# Patient Record
Sex: Male | Born: 1990 | Race: White | Hispanic: No | Marital: Single | Smoking: Current some day smoker
Health system: Southern US, Community
[De-identification: ages and names within clinical notes are randomized; demographics above are authoritative.]

## PROBLEM LIST (undated history)

## (undated) DIAGNOSIS — F32A Depression, unspecified: Secondary | ICD-10-CM

## (undated) DIAGNOSIS — F1990 Other psychoactive substance use, unspecified, uncomplicated: Secondary | ICD-10-CM

## (undated) DIAGNOSIS — F329 Major depressive disorder, single episode, unspecified: Secondary | ICD-10-CM

## (undated) HISTORY — DX: Major depressive disorder, single episode, unspecified: F32.9

## (undated) HISTORY — DX: Other psychoactive substance use, unspecified, uncomplicated: F19.90

## (undated) HISTORY — DX: Depression, unspecified: F32.A

---

## 2017-05-19 ENCOUNTER — Other Ambulatory Visit: Payer: Self-pay | Admitting: *Deleted

## 2017-05-19 ENCOUNTER — Ambulatory Visit
Admission: RE | Admit: 2017-05-19 | Discharge: 2017-05-19 | Disposition: A | Discharge status: Home / Self Care | Payer: No Typology Code available for payment source | Source: Ambulatory Visit | Attending: *Deleted | Admitting: *Deleted

## 2017-05-19 DIAGNOSIS — R222 Localized swelling, mass and lump, trunk: Secondary | ICD-10-CM

## 2017-05-24 ENCOUNTER — Other Ambulatory Visit: Payer: Self-pay | Admitting: *Deleted

## 2017-05-24 DIAGNOSIS — R222 Localized swelling, mass and lump, trunk: Secondary | ICD-10-CM

## 2017-05-28 ENCOUNTER — Ambulatory Visit
Admission: RE | Admit: 2017-05-28 | Discharge: 2017-05-28 | Disposition: A | Discharge status: Home / Self Care | Payer: 59 | Source: Ambulatory Visit | Attending: *Deleted | Admitting: *Deleted

## 2017-05-28 DIAGNOSIS — R222 Localized swelling, mass and lump, trunk: Secondary | ICD-10-CM

## 2017-05-28 MED ORDER — IOPAMIDOL (ISOVUE-300) INJECTION 61%
75.0000 mL | Freq: Once | INTRAVENOUS | Status: AC | PRN
  Administered 2017-05-28: 75 mL via INTRAVENOUS

## 2017-06-01 ENCOUNTER — Other Ambulatory Visit: Payer: Self-pay | Admitting: Family Medicine

## 2017-06-01 DIAGNOSIS — R222 Localized swelling, mass and lump, trunk: Secondary | ICD-10-CM

## 2017-06-01 DIAGNOSIS — N2889 Other specified disorders of kidney and ureter: Secondary | ICD-10-CM

## 2017-06-08 ENCOUNTER — Ambulatory Visit
Admission: RE | Admit: 2017-06-08 | Discharge: 2017-06-08 | Disposition: A | Discharge status: Home / Self Care | Payer: 59 | Source: Ambulatory Visit | Attending: Family Medicine | Admitting: Family Medicine

## 2017-06-08 DIAGNOSIS — N2889 Other specified disorders of kidney and ureter: Secondary | ICD-10-CM

## 2017-06-08 DIAGNOSIS — R222 Localized swelling, mass and lump, trunk: Secondary | ICD-10-CM

## 2017-06-08 MED ORDER — GADOBENATE DIMEGLUMINE 529 MG/ML IV SOLN
12.0000 mL | Freq: Once | INTRAVENOUS | Status: AC | PRN
  Administered 2017-06-08: 12 mL via INTRAVENOUS

## 2018-08-29 IMAGING — MR MR CHEST MEDIASTINUM WO/W CM
13 series · 16 of 16 positions shown · IV contrast (multihance)
Comparison: PA and lateral chest 05/19/2017.  CT chest 05/28/2017.

CLINICAL DATA: Painful mass lesion on the left side of the chest
which the patient first noticed approximately 6 weeks ago.

EXAM:
MRI CHEST WITHOUT AND WITH CONTRAST
TECHNIQUE: Multiplanar multisequence MR imaging of the chest centered at the
site of the patient's mass was performed before and after contrast
administration.
CONTRAST:  12 cc MultiHance IV.

[Series 4: t1_fl2d_tra_p2_mbh_320 · axial · 5.0mm · 0.69mm/px · 1 of 26 slices shown (1 of 2)]
[im 1/26]
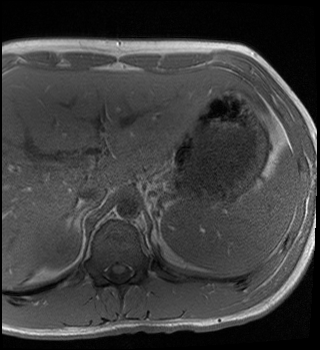

[Series 5: t2_haste_fs_tra_p2_mbh_320 · axial · 5.0mm · 0.69mm/px · 1 of 1 slices shown (1 of 2)]
[im 1/1]
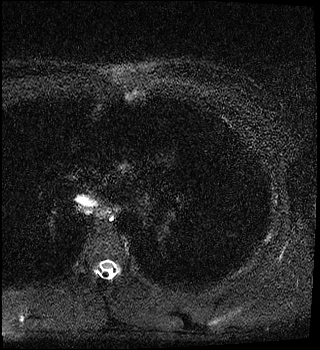

[Series 8: t1_fl2d_tra_p2_mbh_320 · axial · 5.0mm · 0.94mm/px · 1 of 26 slices shown (2 of 2)]
[im 1/26]
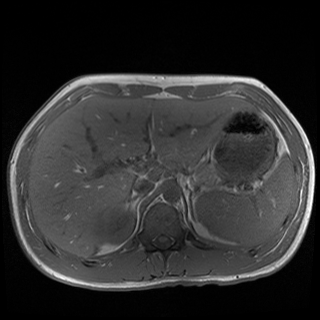

[Series 9: t1_fl2d_fs_tra_p2_mbh_320 · axial · 5.0mm · 0.94mm/px · 1 of 26 slices shown]
[im 1/26]
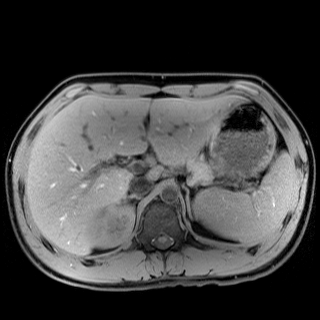

[Series 10: t1_fl2d_fs_sag_p2_mbh_384 · sagittal · 5.0mm · 0.78mm/px · 1 of 24 slices shown (1 of 2)]
[im 1/24]
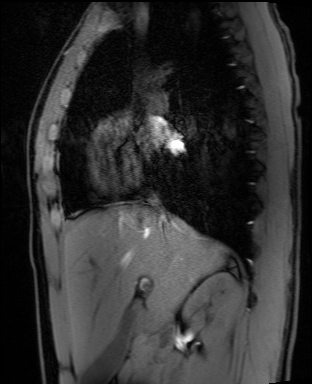

[Series 11: t2_haste_fs_sag_p3_mbh · sagittal · 5.0mm · 0.78mm/px · 1 of 24 slices shown]
[im 1/24]
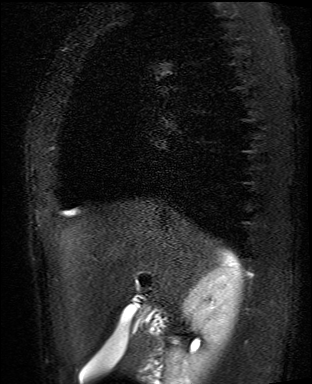

[Series 12: t2_haste_fs_cor_p3_mbh · coronal · 5.0mm · 0.78mm/px · 1 of 20 slices shown]
[im 1/20]
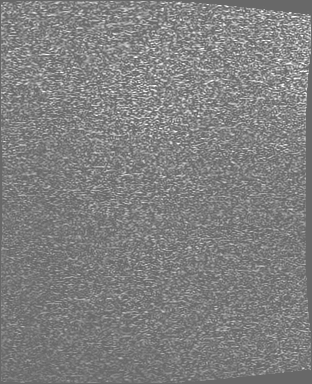

[Series 13: t2_haste_fs_tra_p2_mbh_320 · axial · 5.0mm · 0.94mm/px · 1 of 26 slices shown (2 of 2)]
[im 1/26]
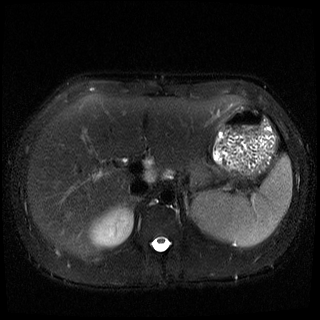

[Series 14: t1_fl2d_sag_p2_mbh_384 · sagittal · 5.0mm · 0.78mm/px · 1 of 24 slices shown]
[im 1/24]
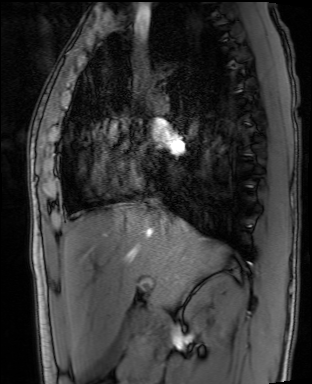

[Series 15: T1 dynamic · axial · non-contrast · 3.0mm · 1.25mm/px · z∈[-37,+104]mm · 2 of 48 slices shown (1 of 2)]
[im 1/48]
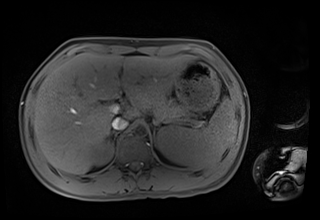
[im 48/48]
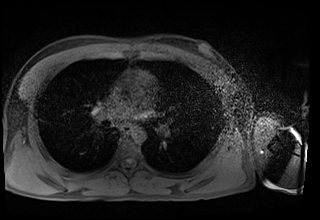

[Series 16: T1 dynamic post-contrast · axial · 3.0mm · 1.25mm/px · z∈[-37,+104]mm · 2 of 48 slices shown]
[im 1/48]
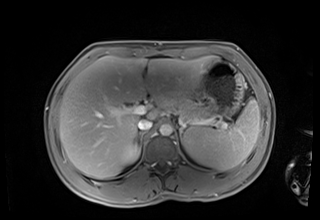
[im 48/48]
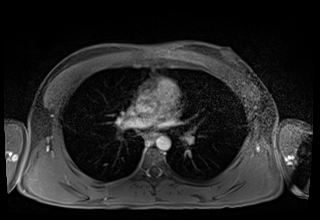

[Series 17: t1_fl2d_fs_sag_p2_mbh_384 · sagittal · 5.0mm · 0.78mm/px · 1 of 24 slices shown (2 of 2)]
[im 1/24]
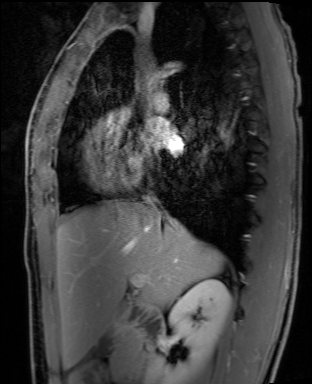

[Series 18: T1 dynamic · sagittal · 3.0mm · 1.25mm/px · 2 of 48 slices shown (2 of 2)]
[im 1/48]
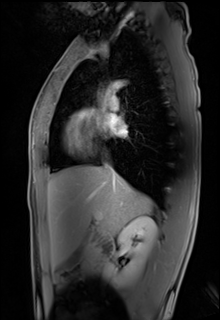
[im 48/48]
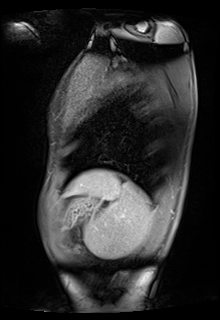

[16 of 16 positions shown; findings below may reference images not displayed]

FINDINGS: As seen on the patient's CT scan, there is a mass lesion in the
anterior chest wall centered between the anterior margins of the
third and fourth ribs. The lesion is hypointense to skeletal muscle
on T1 weighted imaging and T2 hyperintense with areas of more
intermediate and very high signal intensity on T2 weighted imaging.
The mass demonstrates contrast enhancement. It measures
approximately 2.2 cm AP x 8.5 cm transverse x 6.4 cm craniocaudal.
There is abnormal hole edema and enhancement in the the far left
periphery of the sternum at the site of the lesion. The abnormal
edema and enhancement are also seen in the lateral 2.2 cm of the
left fourth rib. Abnormal enhancement extends into the pectoralis
major which is superficial to the lesion. The lesion does not appear
to extend into the thoracic cavity.
IMPRESSION: Enhancing mass lesion in the anterior left chest wall as described
above cannot be definitively characterized but is worrisome for
malignant tumor such as Ewing sarcoma or dedifferentiated
chondrosarcoma. Fibromatosis is also within the differential.
Recommend biopsy and consultation recommend consultation with
Orthopedic Oncology and biopsy.

## 2020-04-01 ENCOUNTER — Ambulatory Visit (INDEPENDENT_AMBULATORY_CARE_PROVIDER_SITE_OTHER): Payer: Managed Care, Other (non HMO) | Admitting: Internal Medicine

## 2020-04-01 ENCOUNTER — Encounter: Payer: Self-pay | Admitting: Internal Medicine

## 2020-04-01 ENCOUNTER — Other Ambulatory Visit: Payer: Self-pay

## 2020-04-01 ENCOUNTER — Telehealth: Payer: Self-pay | Admitting: Pharmacy Technician

## 2020-04-01 VITALS — BP 119/81 | HR 86 | Temp 97.9°F | Wt 137.0 lb

## 2020-04-01 DIAGNOSIS — Z113 Encounter for screening for infections with a predominantly sexual mode of transmission: Secondary | ICD-10-CM | POA: Insufficient documentation

## 2020-04-01 DIAGNOSIS — B182 Chronic viral hepatitis C: Secondary | ICD-10-CM | POA: Diagnosis not present

## 2020-04-01 NOTE — Telephone Encounter (Signed)
[facility] Patient Product/process development scientist completed.    The patient is uninsured (his plan will not cover specialty medications) and will need patient assistance for medication.  We can complete the application and will need to meet with the patient for signatures and income documentation.  Netty Starring. Dimas Aguas CPhT Specialty Pharmacy Patient [facility] for Infectious Disease Phone: [phone] Fax:  [phone]

## 2020-04-01 NOTE — Assessment & Plan Note (Signed)
Pt requesting GC/CT screening so will do today as well.

## 2020-04-01 NOTE — Patient Instructions (Signed)
Thank you for coming to see me today. It was a pleasure.   Today we talked about:  -- treatment of hepatitis C.  Once we get the labs back that we need, someone from our clinic will contact you to discuss treatment options  Your To Do Items: -- Get labs done.  Once we have all the results someone from our clinic will update you   Please follow-up with [place] after we get your labs back  If you have any questions or concerns, please do not hesitate to call the office at [phone].  Take Care,   Gwynn Burly, DO

## 2020-04-01 NOTE — Assessment & Plan Note (Signed)
New Patient with Chronic Hepatitis C genotype unknown, untreated.   I discussed with the patient the lab findings that confirm chronic hepatitis C.  I discussed the pathogenesis, transmission, prevention, risks of left untreated, and treatment options for hepatitis C. I  discussed the importance and benefits of treatment.  Plan: -- Patient counseled on limiting acetaminophen, avoidance of alcohol. -- Transmission discussed with patient including sexual transmission, injection drug use, sharing razors and toothbrush.   -- Will need referral to gastroenterology if concern for cirrhosis -- Will prescribe appropriate medication based on genotype and coverage  -- Hepatitis A and B titers with vaccination as needed -- Pneumococcal vaccine if not previously given and evidence of cirrhosis -- Further work up to include liver staging with Fibrosure and calculate FIB-4 score.  Elastography if concern for advanced fibrosis and/or cirrhosis -- Will follow up after starting medication -- Labs needed:   CBC w diff CMP PT/INR Hep A and B serologies  HIV HCV genotype Measure of fibrosis

## 2020-04-01 NOTE — Progress Notes (Signed)
[place] for Infectious Disease   Reason for Consult: Consideration for treatment for chronic hepatitis C Referring Provider: Dr Joyce Gross  HPI:   Justin Arroyo is a 29 y.o. male who presents for initial evaluation and management of chronic hepatitis C.   Past medical history as below.  Patient tested positive 02/2020.  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  Patient denies history of blood transfusion.  Patient has had other studies performed.  Results: hepatitis C RNA by PCR, result: positive.  Patient has not had prior treatment for Hepatitis C.  Patient does not have a past history of liver disease.  Patient does not have a family history of liver disease.  Patient does not  have associated signs or symptoms related to liver disease.   Labs reviewed and confirm chronic hepatitis C with a positive viral load.    Records reviewed from [facility].  Patient does not have documented immunity to Hepatitis A. Patient does not have documented immunity to Hepatitis B.    Patient's Medications  New Prescriptions   No medications on file  Previous Medications   BUPROPION (WELLBUTRIN XL) 150 MG 24 HR TABLET    Take 150 mg by mouth every morning.   IBUPROFEN (ADVIL) 200 MG TABLET    Take by mouth.   QUETIAPINE (SEROQUEL) 100 MG TABLET    Take by mouth.  Modified Medications   No medications on file  Discontinued Medications   No medications on file      Past Medical History:  Diagnosis Date  . Depressive disorder   . Drug use    living at [facility] now    Social History   Tobacco Use  . Smoking status: Current Some Day Smoker    Packs/day: 1.00  . Smokeless tobacco: Never Used  Substance Use Topics  . Alcohol use: Not Currently  . Drug use: Not Currently    Comment: previous hx of injection drug use    No family hx of hepatitis C.  No Known  Allergies   Review of Systems: Review of Systems  Constitutional: Negative.   Respiratory: Negative.   Cardiovascular: Negative.   Gastrointestinal: Negative.   Genitourinary: Positive for urgency.  Skin: Negative.      Objective: Vitals:   04/01/20 1546  BP: 119/81  Pulse: 86  Temp: 97.9 F (36.6 C)  SpO2: 100%  Weight: 137 lb (62.1 kg)     There is no height or weight on file to calculate BMI.  Physical Exam Constitutional:      Appearance: Normal appearance.  HENT:     Head: Normocephalic and atraumatic.  Pulmonary:     Effort: Pulmonary effort is normal.  Musculoskeletal:        General: Normal range of motion.  Skin:    General: Skin is warm and dry.     Comments: Multiple tattoos.  Neurological:     General: No focal deficit present.     Mental Status: He is alert and oriented to person, place, and time.  Psychiatric:        Mood and Affect: Mood normal.        Behavior: Behavior normal.     Pertinent Labs and Microbiology: 03/10/20 Creatinine 0.85 GFR 120 Tbili 0.2 Alk phos 65 AST 58 ALT 103 WBC 5.5 Platelets 302  RPR non-reactive HIV negative  Hep A IgM negative Hep  B surface Ag negative Hep B core Ab negative HCV Ab positive HCV RNA 2,230,000 HCV genotype not done  Labs and history reviewed and show FIB-4 score: 0.51.    Interpretation: Using a lower cutoff value of 1.45, a FIB-4 score <1.45 had a negative predictive value of 90% for advanced fibrosis (Ishak fibrosis score 4-6 which includes early bridging fibrosis to cirrhosis). In contrast, a FIB-4 >3.25 would have a 97% specificity and a positive predictive value of 65% for advanced fibrosis. In the patient cohort in which this formula was first validated, at least 70% patients had values <1.45 or >3.25. Authors argued that these individuals could potentially have avoided liver biopsy with an overall accuracy of 86%.   Imaging: n/a    Assessment and Plan: Chronic hepatitis C  without hepatic coma ([place]) New Patient with Chronic Hepatitis C genotype unknown, untreated.   I discussed with the patient the lab findings that confirm chronic hepatitis C.  I discussed the pathogenesis, transmission, prevention, risks of left untreated, and treatment options for hepatitis C. I  discussed the importance and benefits of treatment.  Plan: -- Patient counseled on limiting acetaminophen, avoidance of alcohol. -- Transmission discussed with patient including sexual transmission, injection drug use, sharing razors and toothbrush.   -- Will need referral to gastroenterology if concern for cirrhosis -- Will prescribe appropriate medication based on genotype and coverage  -- Hepatitis A and B titers with vaccination as needed -- Pneumococcal vaccine if not previously given and evidence of cirrhosis -- Further work up to include liver staging with Fibrosure and calculate FIB-4 score.  Elastography if concern for advanced fibrosis and/or cirrhosis -- Will follow up after starting medication -- Labs needed:   CBC w diff CMP PT/INR Hep A and B serologies  HIV HCV genotype Measure of fibrosis   Screening for venereal disease Pt requesting GC/CT screening so will do today as well.   Patients cell phone is [phone]    Orders Placed This Encounter  Procedures  . Hepatitis B surface antibody,qualitative  . Hepatitis A Ab, Total  . Hepatitis C genotype  . Protime-INR  . CBC  . Comp Met (CMET)  . HIV antibody (with reflex)  . Liver Fibrosis, FibroTest-ActiTest        [facility] 04/01/2020, 4:58 PM

## 2020-04-08 LAB — LIVER FIBROSIS, FIBROTEST-ACTITEST
ALT: 157 U/L — ABNORMAL HIGH (ref 9–46)
Alpha-2-Macroglobulin: 172 mg/dL (ref 106–279)
Apolipoprotein A1: 132 mg/dL (ref 94–176)
Bilirubin: 0.3 mg/dL (ref 0.2–1.2)
Fibrosis Score: 0.11
GGT: 55 U/L (ref 3–70)
Haptoglobin: 112 mg/dL (ref 43–212)
Necroinflammat ACT Score: 0.68
Reference ID: 3581483

## 2020-04-08 LAB — CBC
HCT: 46.1 % (ref 38.5–50.0)
Hemoglobin: 15 g/dL (ref 13.2–17.1)
MCH: 28.5 pg (ref 27.0–33.0)
MCHC: 32.5 g/dL (ref 32.0–36.0)
MCV: 87.6 fL (ref 80.0–100.0)
MPV: 10.3 fL (ref 7.5–12.5)
Platelets: 241 10*3/uL (ref 140–400)
RBC: 5.26 10*6/uL (ref 4.20–5.80)
RDW: 14.5 % (ref 11.0–15.0)
WBC: 7.3 10*3/uL (ref 3.8–10.8)

## 2020-04-08 LAB — COMPREHENSIVE METABOLIC PANEL
AG Ratio: 1.4 (calc) (ref 1.0–2.5)
ALT: 157 U/L — ABNORMAL HIGH (ref 9–46)
AST: 54 U/L — ABNORMAL HIGH (ref 10–40)
Albumin: 4.4 g/dL (ref 3.6–5.1)
Alkaline phosphatase (APISO): 79 U/L (ref 36–130)
BUN: 13 mg/dL (ref 7–25)
CO2: 29 mmol/L (ref 20–32)
Calcium: 9.7 mg/dL (ref 8.6–10.3)
Chloride: 104 mmol/L (ref 98–110)
Creat: 0.87 mg/dL (ref 0.60–1.35)
Globulin: 3.2 g/dL (calc) (ref 1.9–3.7)
Glucose, Bld: 86 mg/dL (ref 65–99)
Potassium: 4.3 mmol/L (ref 3.5–5.3)
Sodium: 139 mmol/L (ref 135–146)
Total Bilirubin: 0.3 mg/dL (ref 0.2–1.2)
Total Protein: 7.6 g/dL (ref 6.1–8.1)

## 2020-04-08 LAB — PROTIME-INR
INR: 1
Prothrombin Time: 10.8 s (ref 9.0–11.5)

## 2020-04-08 LAB — HIV ANTIBODY (ROUTINE TESTING W REFLEX): HIV 1&2 Ab, 4th Generation: NONREACTIVE

## 2020-04-08 LAB — HEPATITIS B SURFACE ANTIBODY,QUALITATIVE: Hep B S Ab: NONREACTIVE

## 2020-04-08 LAB — HEPATITIS C GENOTYPE: HCV Genotype: 2

## 2020-04-08 LAB — HEPATITIS A ANTIBODY, TOTAL: Hepatitis A AB,Total: NONREACTIVE

## 2020-04-09 ENCOUNTER — Other Ambulatory Visit: Payer: Self-pay | Admitting: Pharmacist

## 2020-04-09 DIAGNOSIS — B182 Chronic viral hepatitis C: Secondary | ICD-10-CM

## 2020-04-09 MED ORDER — MAVYRET 100-40 MG PO TABS: 3.0000 | ORAL_TABLET | Freq: Every day | ORAL | 1 refills | Status: AC

## 2020-04-09 NOTE — Progress Notes (Signed)
Patient will take Mavyret x 8 weeks for his chronic Hepatitis C infection. Will start patient assistance process for medication approval.

## 2020-04-12 ENCOUNTER — Telehealth: Payer: Self-pay

## 2020-04-12 NOTE — Telephone Encounter (Signed)
[facility] Patient Advocate Encounter  Completed and sent [employer] application for MAVYRET  for this patient who is uninsured.    Patient is approved 04/12/20 through 10/10/20.      Clearance Coots, CPhT Specialty Pharmacy Patient [facility] for Infectious Disease Phone: [phone] Fax:  [phone]

## 2020-04-22 ENCOUNTER — Other Ambulatory Visit: Payer: Self-pay | Admitting: Pharmacist

## 2020-04-22 ENCOUNTER — Telehealth: Payer: Self-pay | Admitting: Pharmacist

## 2020-04-22 NOTE — Telephone Encounter (Signed)
Contacted patient regarding Mavyret education and counseling for Hepatitis C management. Left HIPAA compliant voicemail requesting patient to call [facility] pharmacy.  Fabio Neighbors, PharmD PGY2 Ambulatory Care Resident [facility]  Pharmacy

## 2020-04-22 NOTE — Telephone Encounter (Signed)
Provided Mayvret medication counseling for HCV management. Discussed taking Mavyret 3 tablets daily with food for a total of 8 weeks. Discussed the importance of medication adherence and to contact [facility] if starting any new medications. Discussed most common side-effects including nausea, fatigue, and headache. Encouraged patient to avoid alcohol intake if possible due to potential liver damage with HCV infection. Patient verbalized understanding. Follow-up visit scheduled for December 2021.

## 2020-05-29 ENCOUNTER — Other Ambulatory Visit: Payer: Self-pay

## 2020-05-29 ENCOUNTER — Telehealth: Payer: Self-pay

## 2020-05-29 ENCOUNTER — Ambulatory Visit (INDEPENDENT_AMBULATORY_CARE_PROVIDER_SITE_OTHER): Payer: Managed Care, Other (non HMO) | Admitting: Pharmacist

## 2020-05-29 DIAGNOSIS — Z23 Encounter for immunization: Secondary | ICD-10-CM | POA: Diagnosis not present

## 2020-05-29 DIAGNOSIS — B182 Chronic viral hepatitis C: Secondary | ICD-10-CM

## 2020-05-29 NOTE — Telephone Encounter (Signed)
[facility] Patient Advocate Encounter   MY[employer]IE will deliver Mavyret on Friday 05/31/20.    Clearance Coots, CPhT Specialty Pharmacy Patient [facility] for Infectious Disease Phone: [phone] Fax:  [phone]

## 2020-05-29 NOTE — Progress Notes (Signed)
HPI: Justin Arroyo is a 29 y.o. male who presents to the [facility] pharmacy clinic for Hepatitis C follow-up.  Medication: Mavyret x 8 weeks  Start Date: 04/29/20  Hepatitis C Genotype: 2  Fibrosis Score: F0  Hepatitis C RNA: 2.2 million  Patient Active Problem List   Diagnosis Date Noted  . Chronic hepatitis C without hepatic coma (HCC) 04/01/2020  . Screening for venereal disease 04/01/2020    Patient's Medications  New Prescriptions   No medications on file  Previous Medications   BUPROPION (WELLBUTRIN XL) 150 MG 24 HR TABLET    Take 150 mg by mouth every morning.   GLECAPREVIR-PIBRENTASVIR (MAVYRET) 100-40 MG TABS    Take 3 tablets by mouth daily with breakfast.   IBUPROFEN (ADVIL) 200 MG TABLET    Take by mouth.   OMEPRAZOLE (PRILOSEC) 20 MG CAPSULE    Take 20 mg by mouth daily.   QUETIAPINE (SEROQUEL) 100 MG TABLET    Take by mouth.  Modified Medications   No medications on file  Discontinued Medications   No medications on file    Allergies: No Known Allergies  Past Medical History: Past Medical History:  Diagnosis Date  . Depressive disorder   . Drug use    living at [facility] now    Social History: Social History   Socioeconomic History  . Marital status: Single    Spouse name: Not on file  . Number of children: Not on file  . Years of education: Not on file  . Highest education level: Not on file  Occupational History  . Not on file  Tobacco Use  . Smoking status: Current Some Day Smoker    Packs/day: 1.00  . Smokeless tobacco: Never Used  Substance and Sexual Activity  . Alcohol use: Not Currently  . Drug use: Not Currently    Comment: previous hx of injection drug use  . Sexual activity: Not on file  Other Topics Concern  . Not on file  Social History Narrative  . Not on file   Social Determinants of Health   Financial Resource Strain:   . Difficulty of Paying Living Expenses: Not on file  Food Insecurity:   . Worried About  Programme researcher, broadcasting/film/video in the Last Year: Not on file  . Ran Out of Food in the Last Year: Not on file  Transportation Needs:   . Lack of Transportation (Medical): Not on file  . Lack of Transportation (Non-Medical): Not on file  Physical Activity:   . Days of Exercise per Week: Not on file  . Minutes of Exercise per Session: Not on file  Stress:   . Feeling of Stress : Not on file  Social Connections:   . Frequency of Communication with Friends and Family: Not on file  . Frequency of Social Gatherings with Friends and Family: Not on file  . Attends Religious Services: Not on file  . Active Member of Clubs or Organizations: Not on file  . Attends Banker Meetings: Not on file  . Marital Status: Not on file    Labs: Hepatitis C Lab Results  Component Value Date   HCVGENOTYPE 2 04/01/2020   FIBROSTAGE F0 04/01/2020   Hepatitis B Lab Results  Component Value Date   HEPBSAB NON-REACTIVE 04/01/2020   Hepatitis A Lab Results  Component Value Date   HAV NON-REACTIVE 04/01/2020   HIV Lab Results  Component Value Date   HIV NON-REACTIVE 04/01/2020   Lab Results  Component Value Date   CREATININE 0.87 04/01/2020   Lab Results  Component Value Date   AST 54 (H) 04/01/2020   ALT 157 (H) 04/01/2020   ALT 157 (H) 04/01/2020   INR 1.0 04/01/2020    Assessment: Justin Arroyo is here today for his 4-week Hep C follow up. He started 8 weeks of Mavyret back on 11/1. He comes in today and states that he never received his 2nd month of Mayvret from the patient assistance program and took his last dose on Sunday (it is Wednesday). I asked him why he didn't reach out to the clinic or the pharmacy and he stated that he did not know who to reach out to. We had an extra month of Mavyret in the clinic, so I gave him that and we will get the patient assistance program to mail [place] his 2nd month.   Otherwise, he takes all three tablets together in the morning with breakfast. He has not  missed any other doses. He was fatigued when he first started taking it but it has since resolved. No other side effects or issues. Encouraged him to not miss anymore doses during his last month. I administered his Hepatitis A and B vaccines today as well. Will check labs today and have him come back and see me in 4 weeks for end of treatment follow up and his 2nd Hepatitis B vaccine. He will see Dr. Earlene Arroyo for his cure visit in April.   Plan: - Continue Mavyret for 8 weeks total - Hep C RNA + CMET today - Hepatitis A vaccine today - Hepatitis B vaccine today - F/u with me again on 1/6 at 4pm for 2nd Hep B vaccine and end of treatment follow up  Justin Arroyo L. Justin Arroyo, PharmD, BCIDP, AAHIVP, CPP Clinical Pharmacist Practitioner Infectious Diseases Clinical Pharmacist [facility] 05/29/2020, 2:49 PM

## 2020-05-31 LAB — COMPREHENSIVE METABOLIC PANEL
AG Ratio: 1.5 (calc) (ref 1.0–2.5)
ALT: 21 U/L (ref 9–46)
AST: 16 U/L (ref 10–40)
Albumin: 4.3 g/dL (ref 3.6–5.1)
Alkaline phosphatase (APISO): 56 U/L (ref 36–130)
BUN: 17 mg/dL (ref 7–25)
CO2: 28 mmol/L (ref 20–32)
Calcium: 9.9 mg/dL (ref 8.6–10.3)
Chloride: 104 mmol/L (ref 98–110)
Creat: 1.03 mg/dL (ref 0.60–1.35)
Globulin: 2.8 g/dL (calc) (ref 1.9–3.7)
Glucose, Bld: 78 mg/dL (ref 65–99)
Potassium: 4.2 mmol/L (ref 3.5–5.3)
Sodium: 139 mmol/L (ref 135–146)
Total Bilirubin: 0.2 mg/dL (ref 0.2–1.2)
Total Protein: 7.1 g/dL (ref 6.1–8.1)

## 2020-05-31 LAB — HEPATITIS C RNA QUANTITATIVE
HCV Quantitative Log: 1.38 log IU/mL — ABNORMAL HIGH
HCV RNA, PCR, QN: 24 IU/mL — ABNORMAL HIGH

## 2020-06-03 ENCOUNTER — Telehealth: Payer: Self-pay

## 2020-06-03 NOTE — Telephone Encounter (Signed)
[facility] Patient Advocate Encounter  Patient's medications (Mavyret) have been delivered to [facility] from [facility] and will be picked up.  Clearance Coots , CPhT Specialty Pharmacy Patient [facility] for Infectious Disease Phone: [phone] Fax:  [phone]

## 2020-06-05 ENCOUNTER — Other Ambulatory Visit: Payer: Self-pay | Admitting: Urology

## 2020-06-05 ENCOUNTER — Other Ambulatory Visit (HOSPITAL_COMMUNITY): Payer: Self-pay | Admitting: Urology

## 2020-06-05 DIAGNOSIS — N281 Cyst of kidney, acquired: Secondary | ICD-10-CM

## 2020-06-08 ENCOUNTER — Encounter (HOSPITAL_COMMUNITY): Payer: Self-pay

## 2020-06-08 ENCOUNTER — Other Ambulatory Visit: Payer: Self-pay

## 2020-06-08 ENCOUNTER — Inpatient Hospital Stay (HOSPITAL_COMMUNITY)
Admission: EM | Admit: 2020-06-08 | Discharge: 2020-06-10 | DRG: 917 | Disposition: A | Discharge status: Home / Self Care | Payer: Managed Care, Other (non HMO) | Attending: Pulmonary Disease | Admitting: Pulmonary Disease

## 2020-06-08 DIAGNOSIS — F32A Depression, unspecified: Secondary | ICD-10-CM | POA: Diagnosis present

## 2020-06-08 DIAGNOSIS — Z79899 Other long term (current) drug therapy: Secondary | ICD-10-CM

## 2020-06-08 DIAGNOSIS — F17210 Nicotine dependence, cigarettes, uncomplicated: Secondary | ICD-10-CM | POA: Diagnosis present

## 2020-06-08 DIAGNOSIS — B1920 Unspecified viral hepatitis C without hepatic coma: Secondary | ICD-10-CM | POA: Diagnosis present

## 2020-06-08 DIAGNOSIS — G928 Other toxic encephalopathy: Secondary | ICD-10-CM | POA: Diagnosis present

## 2020-06-08 DIAGNOSIS — R7401 Elevation of levels of liver transaminase levels: Secondary | ICD-10-CM | POA: Diagnosis present

## 2020-06-08 DIAGNOSIS — Z791 Long term (current) use of non-steroidal anti-inflammatories (NSAID): Secondary | ICD-10-CM

## 2020-06-08 DIAGNOSIS — T50901A Poisoning by unspecified drugs, medicaments and biological substances, accidental (unintentional), initial encounter: Secondary | ICD-10-CM | POA: Diagnosis not present

## 2020-06-08 DIAGNOSIS — F1190 Opioid use, unspecified, uncomplicated: Secondary | ICD-10-CM | POA: Diagnosis present

## 2020-06-08 DIAGNOSIS — T40411A Poisoning by fentanyl or fentanyl analogs, accidental (unintentional), initial encounter: Secondary | ICD-10-CM | POA: Diagnosis not present

## 2020-06-08 DIAGNOSIS — T401X1A Poisoning by heroin, accidental (unintentional), initial encounter: Secondary | ICD-10-CM

## 2020-06-08 DIAGNOSIS — Z20822 Contact with and (suspected) exposure to covid-19: Secondary | ICD-10-CM | POA: Diagnosis present

## 2020-06-08 MED ORDER — NALOXONE HCL 0.4 MG/ML IJ SOLN
0.4000 mg | Freq: Once | INTRAMUSCULAR | Status: AC
  Administered 2020-06-08: 23:00:00 0.4 mg via INTRAVENOUS
  Filled 2020-06-08: qty 1

## 2020-06-08 NOTE — ED Triage Notes (Signed)
BIB EMS from side of road overdose of Heroin unresponsive breathing 4-6 times a minutes. 0.5 or Narcan IV R Hand Initial Heartrate or o2 saturation in mid 80's after narcan HR 116 o2 saturation 96% on RA

## 2020-06-08 NOTE — ED Provider Notes (Signed)
[facility]-EMERGENCY DEPT Provider Note   CSN: [id number] Arrival date & time: 06/08/20  2245     History No chief complaint on file.   Justin Arroyo is a 29 y.o. male.  Patient presents to the emergency department with a chief complaint of overdose.  He was brought in by EMS after being found on the side of the road.  He was breathing 4-6 times a minute.  He was given 0.5 mg of Narcan with good response.  He states that he used heroin a few hours ago.  He reports this was an accidental overdose.  He has no intent to kill himself.  Denies any other coingestants.  The history is provided by the patient. No language interpreter was used.       Past Medical History:  Diagnosis Date  . Depressive disorder   . Drug use    living at [facility] now    Patient Active Problem List   Diagnosis Date Noted  . Chronic hepatitis C without hepatic coma (HCC) 04/01/2020  . Screening for venereal disease 04/01/2020    History reviewed. No pertinent surgical history.     Family History  Problem Relation Age of Onset  . Hepatitis Neg Hx     Social History   Tobacco Use  . Smoking status: Current Some Day Smoker    Packs/day: 1.00  . Smokeless tobacco: Never Used  Substance Use Topics  . Alcohol use: Not Currently  . Drug use: Not Currently    Comment: previous hx of injection drug use    Home Medications Prior to Admission medications   Medication Sig Start Date End Date Taking? Authorizing Provider  buPROPion (WELLBUTRIN XL) 150 MG 24 hr tablet Take 150 mg by mouth every morning. Patient not taking: Reported on 04/01/2020 03/24/20   [provider]  Glecaprevir-Pibrentasvir (MAVYRET) 100-40 MG TABS Take 3 tablets by mouth daily with breakfast. 04/09/20   Kuppelweiser, Cassie L, RPH-CPP  ibuprofen (ADVIL) 200 MG tablet Take by mouth.    [provider]  omeprazole (PRILOSEC) 20 MG capsule Take 20 mg by mouth daily. 03/24/20    [provider]  QUEtiapine (SEROQUEL) 100 MG tablet Take by mouth. Patient not taking: Reported on 04/01/2020 06/15/17   [provider]    Allergies    Patient has no known allergies.  Review of Systems   Review of Systems  All other systems reviewed and are negative.   Physical Exam Updated Vital Signs BP 116/78   Pulse 87   Temp 97.7 F (36.5 C) (Oral)   Resp 12   Ht 5\' 4"  (1.626 m)   Wt 63.5 kg   SpO2 97%   BMI 24.03 kg/m   Physical Exam Vitals and nursing note reviewed.  Constitutional:      Appearance: He is well-developed and well-nourished.  HENT:     Head: Normocephalic and atraumatic.  Eyes:     Conjunctiva/sclera: Conjunctivae normal.  Cardiovascular:     Rate and Rhythm: Normal rate and regular rhythm.     Heart sounds: No murmur heard.   Pulmonary:     Effort: Pulmonary effort is normal. No respiratory distress.     Breath sounds: Normal breath sounds.  Abdominal:     Palpations: Abdomen is soft.     Tenderness: There is no abdominal tenderness.  Musculoskeletal:        General: No edema. Normal range of motion.     Cervical back: Neck supple.  Skin:    General: Skin is warm and dry.     Comments: Diffuse excoriations from picking, no apparent abscess or cellulitis  Neurological:     Mental Status: He is alert and oriented to person, place, and time.  Psychiatric:        Mood and Affect: Mood and affect and mood normal.        Behavior: Behavior normal.     ED Results / Procedures / Treatments   Labs (all labs ordered are listed, but only abnormal results are displayed) Labs Reviewed - No data to display  EKG None  Radiology No results found.  Procedures .Critical Care Performed by: Roxy Horseman, PA-C Authorized by: Roxy Horseman, PA-C   Critical care provider statement:    Critical care time (minutes):  60   Critical care was necessary to treat or prevent imminent or life-threatening deterioration of  the following conditions:  Respiratory failure   Critical care was time spent personally by me on the following activities:  Discussions with consultants, evaluation of patient's response to treatment, examination of patient, ordering and performing treatments and interventions, ordering and review of laboratory studies, ordering and review of radiographic studies, pulse oximetry, re-evaluation of patient's condition, obtaining history from patient or surrogate and review of old charts   (including critical care time)  Medications Ordered in ED Medications  naloxone (NARCAN) injection 0.4 mg (has no administration in time range)    ED Course  I have reviewed the triage vital signs and the nursing notes.  Pertinent labs & imaging results that were available during my care of the patient were reviewed by me and considered in my medical decision making (see chart for details).    MDM Rules/Calculators/A&P                          Patient here with heroin overdose.  He was given Narcan by EMS with good response, became increasingly apneic and harder to respond in the ED and was given another dose of Narcan.  After a few hours, this apnea began to recur, the patient became somewhat more difficult to arouse.  He is started on a Narcan infusion.  Appreciate Dr. Jayme Cloud, from critical care, who will come to evaluate the patient.  Patient did deny any intent for self-harm, and states that this was accidental overdose.   Final Clinical Impression(s) / ED Diagnoses Final diagnoses:  Accidental overdose of heroin, initial encounter [facility])    Rx / DC Orders ED Discharge Orders    None       Roxy Horseman, PA-C 06/09/20 0312    Paula Libra, MD 06/09/20 [phone]

## 2020-06-09 DIAGNOSIS — F17210 Nicotine dependence, cigarettes, uncomplicated: Secondary | ICD-10-CM | POA: Diagnosis present

## 2020-06-09 DIAGNOSIS — Z20822 Contact with and (suspected) exposure to covid-19: Secondary | ICD-10-CM | POA: Diagnosis present

## 2020-06-09 DIAGNOSIS — F1190 Opioid use, unspecified, uncomplicated: Secondary | ICD-10-CM | POA: Diagnosis present

## 2020-06-09 DIAGNOSIS — T50901D Poisoning by unspecified drugs, medicaments and biological substances, accidental (unintentional), subsequent encounter: Secondary | ICD-10-CM | POA: Diagnosis not present

## 2020-06-09 DIAGNOSIS — T50901S Poisoning by unspecified drugs, medicaments and biological substances, accidental (unintentional), sequela: Secondary | ICD-10-CM | POA: Diagnosis not present

## 2020-06-09 DIAGNOSIS — T401X1A Poisoning by heroin, accidental (unintentional), initial encounter: Secondary | ICD-10-CM | POA: Diagnosis present

## 2020-06-09 DIAGNOSIS — G928 Other toxic encephalopathy: Secondary | ICD-10-CM | POA: Diagnosis present

## 2020-06-09 DIAGNOSIS — T50901A Poisoning by unspecified drugs, medicaments and biological substances, accidental (unintentional), initial encounter: Secondary | ICD-10-CM | POA: Diagnosis present

## 2020-06-09 DIAGNOSIS — Z79899 Other long term (current) drug therapy: Secondary | ICD-10-CM | POA: Diagnosis not present

## 2020-06-09 DIAGNOSIS — F32A Depression, unspecified: Secondary | ICD-10-CM | POA: Diagnosis present

## 2020-06-09 DIAGNOSIS — R7401 Elevation of levels of liver transaminase levels: Secondary | ICD-10-CM | POA: Diagnosis present

## 2020-06-09 DIAGNOSIS — T40411A Poisoning by fentanyl or fentanyl analogs, accidental (unintentional), initial encounter: Secondary | ICD-10-CM | POA: Diagnosis present

## 2020-06-09 DIAGNOSIS — Z791 Long term (current) use of non-steroidal anti-inflammatories (NSAID): Secondary | ICD-10-CM | POA: Diagnosis not present

## 2020-06-09 DIAGNOSIS — B1920 Unspecified viral hepatitis C without hepatic coma: Secondary | ICD-10-CM | POA: Diagnosis present

## 2020-06-09 LAB — CBC WITH DIFFERENTIAL/PLATELET
Abs Immature Granulocytes: 0.01 10*3/uL (ref 0.00–0.07)
Basophils Absolute: 0 10*3/uL (ref 0.0–0.1)
Basophils Relative: 0 %
Eosinophils Absolute: 0.4 10*3/uL (ref 0.0–0.5)
Eosinophils Relative: 5 %
HCT: 45.3 % (ref 39.0–52.0)
Hemoglobin: 15 g/dL (ref 13.0–17.0)
Immature Granulocytes: 0 %
Lymphocytes Relative: 38 %
Lymphs Abs: 3 10*3/uL (ref 0.7–4.0)
MCH: 30 pg (ref 26.0–34.0)
MCHC: 33.1 g/dL (ref 30.0–36.0)
MCV: 90.6 fL (ref 80.0–100.0)
Monocytes Absolute: 0.5 10*3/uL (ref 0.1–1.0)
Monocytes Relative: 7 %
Neutro Abs: 3.9 10*3/uL (ref 1.7–7.7)
Neutrophils Relative %: 50 %
Platelets: 209 10*3/uL (ref 150–400)
RBC: 5 MIL/uL (ref 4.22–5.81)
RDW: 14.2 % (ref 11.5–15.5)
WBC: 7.9 10*3/uL (ref 4.0–10.5)
nRBC: 0 % (ref 0.0–0.2)

## 2020-06-09 LAB — RAPID URINE DRUG SCREEN, HOSP PERFORMED
Amphetamines: NOT DETECTED
Barbiturates: NOT DETECTED
Benzodiazepines: POSITIVE — AB
Cocaine: POSITIVE — AB
Opiates: NOT DETECTED
Tetrahydrocannabinol: POSITIVE — AB

## 2020-06-09 LAB — MRSA PCR SCREENING: MRSA by PCR: NEGATIVE

## 2020-06-09 LAB — COMPREHENSIVE METABOLIC PANEL
ALT: 73 U/L — ABNORMAL HIGH (ref 0–44)
AST: 61 U/L — ABNORMAL HIGH (ref 15–41)
Albumin: 4 g/dL (ref 3.5–5.0)
Alkaline Phosphatase: 44 U/L (ref 38–126)
Anion gap: 8 (ref 5–15)
BUN: 6 mg/dL (ref 6–20)
CO2: 28 mmol/L (ref 22–32)
Calcium: 8.6 mg/dL — ABNORMAL LOW (ref 8.9–10.3)
Chloride: 100 mmol/L (ref 98–111)
Creatinine, Ser: 0.81 mg/dL (ref 0.61–1.24)
GFR, Estimated: >60 mL/min (ref >60)
Glucose, Bld: 86 mg/dL (ref 70–99)
Potassium: 3.7 mmol/L (ref 3.5–5.1)
Sodium: 136 mmol/L (ref 135–145)
Total Bilirubin: 0.6 mg/dL (ref 0.3–1.2)
Total Protein: 6.7 g/dL (ref 6.5–8.1)

## 2020-06-09 LAB — ETHANOL: Alcohol, Ethyl (B): <10 mg/dL (ref <10)

## 2020-06-09 LAB — RESP PANEL BY RT-PCR (FLU A&B, COVID) ARPGX2
Influenza A by PCR: NEGATIVE
Influenza B by PCR: NEGATIVE
SARS Coronavirus 2 by RT PCR: NEGATIVE

## 2020-06-09 LAB — SALICYLATE LEVEL: Salicylate Lvl: <7 mg/dL — ABNORMAL LOW (ref 7.0–30.0)

## 2020-06-09 LAB — CBG MONITORING, ED: Glucose-Capillary: 88 mg/dL (ref 70–99)

## 2020-06-09 LAB — ACETAMINOPHEN LEVEL: Acetaminophen (Tylenol), Serum: <10 ug/mL — ABNORMAL LOW (ref 10–30)

## 2020-06-09 MED ORDER — ONDANSETRON HCL 4 MG/2ML IJ SOLN: 4.0000 mg | Freq: Four times a day (QID) | INTRAMUSCULAR | Status: DC | PRN

## 2020-06-09 MED ORDER — FAMOTIDINE 20 MG IN NS 100 ML IVPB
20.0000 mg | Freq: Two times a day (BID) | INTRAVENOUS | Status: DC
  Administered 2020-06-09: 21:00:00 20 mg via INTRAVENOUS
  Filled 2020-06-09 (×2): qty 100

## 2020-06-09 MED ORDER — NALOXONE HCL 4 MG/10ML IJ SOLN
0.2500 mg/h | INTRAVENOUS | Status: DC
  Administered 2020-06-09: 03:00:00 0.25 mg/h via INTRAVENOUS
  Filled 2020-06-09: qty 10

## 2020-06-09 MED ORDER — POLYETHYLENE GLYCOL 3350 17 G PO PACK: 17.0000 g | PACK | Freq: Every day | ORAL | Status: DC | PRN

## 2020-06-09 MED ORDER — ENOXAPARIN SODIUM 40 MG/0.4ML ~~LOC~~ SOLN
40.0000 mg | SUBCUTANEOUS | Status: DC
  Administered 2020-06-09 – 2020-06-10 (×2): 40 mg via SUBCUTANEOUS
  Filled 2020-06-09 (×2): qty 0.4

## 2020-06-09 MED ORDER — DOCUSATE SODIUM 100 MG PO CAPS: 100.0000 mg | ORAL_CAPSULE | Freq: Two times a day (BID) | ORAL | Status: DC | PRN

## 2020-06-09 MED ORDER — NALOXONE HCL 4 MG/10ML IJ SOLN
0.1250 mg/h | INTRAVENOUS | Status: DC
  Filled 2020-06-09: qty 10

## 2020-06-09 MED ORDER — ORAL CARE MOUTH RINSE
15.0000 mL | Freq: Two times a day (BID) | OROMUCOSAL | Status: DC
  Administered 2020-06-09 (×2): 15 mL via OROMUCOSAL

## 2020-06-09 MED ORDER — DEXTROSE-NACL 5-0.45 % IV SOLN: INTRAVENOUS | Status: DC

## 2020-06-09 MED ORDER — CHLORHEXIDINE GLUCONATE CLOTH 2 % EX PADS
6.0000 | MEDICATED_PAD | Freq: Every day | CUTANEOUS | Status: DC
  Administered 2020-06-09 – 2020-06-10 (×2): 6 via TOPICAL

## 2020-06-09 MED ORDER — POTASSIUM CHLORIDE CRYS ER 20 MEQ PO TBCR
40.0000 meq | EXTENDED_RELEASE_TABLET | Freq: Once | ORAL | Status: AC
  Administered 2020-06-09: 09:00:00 40 meq via ORAL
  Filled 2020-06-09: qty 2

## 2020-06-09 MED ORDER — FAMOTIDINE IN NACL 20-0.9 MG/50ML-% IV SOLN
20.0000 mg | Freq: Two times a day (BID) | INTRAVENOUS | Status: DC
  Administered 2020-06-09: 11:00:00 20 mg via INTRAVENOUS
  Filled 2020-06-09 (×3): qty 50

## 2020-06-09 NOTE — ED Notes (Signed)
Pt could not urinate at this time for UA. Grayling Congress, NT

## 2020-06-09 NOTE — H&P (Signed)
NAME:  Justin Arroyo, MRN:  [id number], DOB:  [birth year], LOS: 0 ADMISSION DATE:  06/08/2020, CONSULTATION DATE: 06/09/20 REFERRING MD:  Roxy Horseman, PA, CHIEF COMPLAINT: altered  Brief History   29 year old man found unresponsive, bradypneic.  Responded to narcan.   History of present illness   29 year old man with history of heroin use, brought in to [facility] after being found down.  Was breathing 4-6/min initially, improved with narcan.   Had used heroin a few hours prior, no intention for self-harm.    Utox positive for Benzos, cocaine, thc.   Responds well to narcan. Starting narcan infusion.   Past Medical History  Depression Drug use (heroin) Hepatitis C  Smoker - 1ppd  Med list: wellbutrin xl 150mg  daily, (not taking) mavyret 100/40mg  x 3 in am Prilosec 20mg  daily Seroquel 100mg  daily (not taking)  Significant Hospital Events     Consults:    Procedures:    Significant Diagnostic Tests:    Micro Data:    Antimicrobials:    Interim history/subjective:    Objective   Blood pressure 115/67, pulse 76, temperature 97.7 F (36.5 C), temperature source Oral, resp. rate 18, height 5\' 4"  (1.626 m), weight 63.5 kg, SpO2 97 %.       No intake or output data in the 24 hours ending 06/09/20 0326 Filed Weights   06/08/20 2310  Weight: 63.5 kg    Examination: General: Drowsy but arousable.  Just started on Narcan infusion. RR 14 HENT: NCAT, Pupils dilated but symmetric reactive to light Lungs: CTAB Cardiovascular: RRR no mgr Abdomen: NT, ND ,NBS Extremities: nl Neuro: lethargic, intermittently following commands  Speaks clearly when awake Skin - multiple .5cm-1cm eschar/lesions, appear traumatic due to picking.  Piercings and tattoos.   Resolved Hospital Problem list    Assessment & Plan:  AMS/ encephalopathy:  Overdose. Opiates negative (?), though responding to narcan.  Possible fentanyl overdose. Cont narcan infusion.   Monitor in ICU  LFTs  Slightly elevated Hx of Hep C.    Depression: apparently not taking any antidepressant medication.    Best practice (evaluated daily)   Diet: NPO Pain/Anxiety/Delirium protocol (if indicated):  VAP protocol (if indicated): DVT prophylaxis: lovenox GI prophylaxis: famotidine Glucose control: Mobility:  last date of multidisciplinary goals of care discussion Family and staff present  Summary of discussion  Follow up goals of care discussion due Code Status: Full Code Disposition:   Labs   CBC: Recent Labs  Lab 06/08/20 2358  WBC 7.9  NEUTROABS 3.9  HGB 15.0  HCT 45.3  MCV 90.6  PLT 209    Basic Metabolic Panel: Recent Labs  Lab 06/08/20 2358  NA 136  K 3.7  CL 100  CO2 28  GLUCOSE 86  BUN 6  CREATININE 0.81  CALCIUM 8.6*   GFR: Estimated Creatinine Clearance: 114.7 mL/min (by C-G formula based on SCr of 0.81 mg/dL). Recent Labs  Lab 06/08/20 2358  WBC 7.9    Liver Function Tests: Recent Labs  Lab 06/08/20 2358  AST 61*  ALT 73*  ALKPHOS 44  BILITOT 0.6  PROT 6.7  ALBUMIN 4.0   No results for input(s): LIPASE, AMYLASE in the last 168 hours. No results for input(s): AMMONIA in the last 168 hours.  ABG No results found for: PHART, PCO2ART, PO2ART, HCO3, TCO2, ACIDBASEDEF, O2SAT   Coagulation Profile: No results for input(s): INR, PROTIME in the last 168 hours.  Cardiac Enzymes: No results for input(s): CKTOTAL, CKMB, CKMBINDEX,  TROPONINI in the last 168 hours.  HbA1C: No results found for: HGBA1C  CBG: Recent Labs  Lab 06/09/20 0014  GLUCAP 88    Review of Systems:   Unable, denies most questions but drowsy  Past Medical History  He,  has a past medical history of Depressive disorder and Drug use.   Surgical History   History reviewed. No pertinent surgical history.   Social History   reports that he has been smoking. He has been smoking about 1.00 pack per day. He has never used smokeless tobacco. He reports previous  alcohol use. He reports previous drug use.   Family History   His family history is negative for Hepatitis.   Allergies No Known Allergies   Home Medications  Prior to Admission medications   Medication Sig Start Date End Date Taking? Authorizing Provider  buPROPion (WELLBUTRIN XL) 150 MG 24 hr tablet Take 150 mg by mouth every morning. Patient not taking: Reported on 04/01/2020 03/24/20   [provider]  Glecaprevir-Pibrentasvir (MAVYRET) 100-40 MG TABS Take 3 tablets by mouth daily with breakfast. 04/09/20   Kuppelweiser, Cassie L, RPH-CPP  ibuprofen (ADVIL) 200 MG tablet Take by mouth.    [provider]  omeprazole (PRILOSEC) 20 MG capsule Take 20 mg by mouth daily. 03/24/20   [provider]  QUEtiapine (SEROQUEL) 100 MG tablet Take by mouth. Patient not taking: Reported on 04/01/2020 06/15/17   [provider]     Critical care time: 35 minutes.      Charlotte Sanes MD CCM

## 2020-06-09 NOTE — ED Notes (Addendum)
Pt is easily awoken. Pt is alert and oriented when aroused. Vital signs remain stable RR at 10 and O2 saturation. Remains 96% or higher.

## 2020-06-09 NOTE — ED Notes (Signed)
ICU provider at bedside

## 2020-06-09 NOTE — Plan of Care (Signed)
  Problem: Education: Goal: Knowledge of General Education information will improve Description: Including pain rating scale, medication(s)/side effects and non-pharmacologic comfort measures Outcome: Progressing   Problem: Health Behavior/Discharge Planning: Goal: Ability to manage health-related needs will improve Outcome: Progressing   Problem: Clinical Measurements: Goal: Ability to maintain clinical measurements within normal limits will improve Outcome: Progressing Goal: Will remain free from infection Outcome: Progressing Goal: Diagnostic test results will improve Outcome: Progressing Goal: Respiratory complications will improve Outcome: Progressing Goal: Cardiovascular complication will be avoided Outcome: Progressing   Problem: Activity: Goal: Risk for activity intolerance will decrease Outcome: Progressing   Problem: Nutrition: Goal: Adequate nutrition will be maintained Outcome: Progressing   Problem: Coping: Goal: Level of anxiety will decrease Outcome: Progressing   Problem: Elimination: Goal: Will not experience complications related to bowel motility Outcome: Progressing Goal: Will not experience complications related to urinary retention Outcome: Progressing   Problem: Pain Managment: Goal: General experience of comfort will improve Outcome: Progressing   Problem: Safety: Goal: Ability to remain free from injury will improve Outcome: Progressing   Problem: Skin Integrity: Goal: Risk for impaired skin integrity will decrease Outcome: Progressing   Problem: Education: Goal: Utilization of techniques to improve thought processes will improve Outcome: Progressing Goal: Knowledge of the prescribed therapeutic regimen will improve Outcome: Progressing   Problem: Activity: Goal: Interest or engagement in leisure activities will improve Outcome: Progressing Goal: Imbalance in normal sleep/wake cycle will improve Outcome: Progressing   Problem:  Coping: Goal: Coping ability will improve Outcome: Progressing Goal: Will verbalize feelings Outcome: Progressing   Problem: Health Behavior/Discharge Planning: Goal: Ability to make decisions will improve Outcome: Progressing Goal: Compliance with therapeutic regimen will improve Outcome: Progressing   Problem: Role Relationship: Goal: Will demonstrate positive changes in social behaviors and relationships Outcome: Progressing   Problem: Safety: Goal: Ability to disclose and discuss suicidal ideas will improve Outcome: Progressing Goal: Ability to identify and utilize support systems that promote safety will improve Outcome: Progressing   Problem: Self-Concept: Goal: Will verbalize positive feelings about self Outcome: Progressing Goal: Level of anxiety will decrease Outcome: Progressing   

## 2020-06-09 NOTE — Progress Notes (Signed)
Dr. Chestine Spore said to turn off Narcan since pt o2 stats were in the 99% and rr was 19. Dr. Chestine Spore sai if o2 requirements increase or rr decreases, turn Narcan back on. Narcan is turned off and pt is stable. This nurse will continue to monitor.

## 2020-06-09 NOTE — ED Notes (Signed)
Pt able to stand at bedside and urinate in urinal.

## 2020-06-09 NOTE — Progress Notes (Signed)
NAME:  Justin Arroyo, MRN:  [id number], DOB:  12/27/1990, LOS: 0 ADMISSION DATE:  06/08/2020, CONSULTATION DATE: 06/09/20 REFERRING MD:  Roxy Horseman, PA, CHIEF COMPLAINT: altered  Brief History   29 year old man found unresponsive, bradypneic.  Responded to narcan.   History of present illness   29 year old man with history of heroin use, brought in to [facility] after being found down.  Was breathing 4-6/min initially, improved with narcan.   Had used heroin a few hours prior, no intention for self-harm.    Utox positive for Benzos, cocaine, thc.   Responds well to narcan. Starting narcan infusion.   Past Medical History  Depression Drug use (heroin) Hepatitis C  Smoker - 1ppd  Med list: wellbutrin xl 150mg  daily, (not taking) mavyret 100/40mg  x 3 in am Prilosec 20mg  daily Seroquel 100mg  daily (not taking)  Significant Hospital Events     Consults:    Procedures:    Significant Diagnostic Tests:    Micro Data:  covid negative  Antimicrobials:    Interim history/subjective:  Sleeping, denies complaints this morning.  Objective   Blood pressure 106/63, pulse 78, temperature 97.8 F (36.6 C), temperature source Oral, resp. rate 16, height 5\' 4"  (1.626 m), weight 65.7 kg, SpO2 97 %.        Intake/Output Summary (Last 24 hours) at 06/09/2020 0740 Last data filed at 06/09/2020 0600 Gross per 24 hour  Intake 111.64 ml  Output --  Net 111.64 ml   Filed Weights   06/08/20 2310 06/09/20 0450  Weight: 63.5 kg 65.7 kg    Examination: General: Young man sleeping in bed comfortably HENT: [place]/AT, eyes anicteric, lip piercing Lungs: Breathing comfortably on room air, respiratory rate 17.  Clear to auscultation bilaterally. Cardiovascular: Regular rate and rhythm, no murmurs Abdomen: Soft, nontender, nondistended Extremities: No peripheral edema, no clubbing or cyanosis Neuro: Sleepy but arousable to verbal stimulation, RASS -2 on Narcan infusion Skin -multiple  tattoos.  Thorough skin exam not completed.  Resolved Hospital Problem list    Assessment & Plan:  Acute encephalopathy due to drug OD-he does not remember what he took Overdose. Opiates negative (?), though responding to narcan. Likely fentanyl overdose. -Continue Narcan infusion until more awake consistently.  He is unsure what he took predicting the half-life is difficult. -Monitor in ICU -Okay to continue IV fluids until awake and eating  Elevated transaminase levels Hx of Hep C.   -Outpatient referral to infectious disease clinic recommend  Depression: apparently not taking any antidepressant medication.  -Recommend psychiatric evaluation in the outpatient -Need to assess if there is intention to harm when more alert and awake  H/O IVDU -HIV negative  Best practice (evaluated daily)   Diet: NPO Pain/Anxiety/Delirium protocol (if indicated): n/a VAP protocol (if indicated): n/a DVT prophylaxis: lovenox GI prophylaxis: famotidine Glucose control:n/a Mobility: progressive last date of multidisciplinary goals of care discussion Family and staff present  Summary of discussion  Follow up goals of care discussion due Code Status: Full Code Disposition:   Labs   CBC: Recent Labs  Lab 06/08/20 2358  WBC 7.9  NEUTROABS 3.9  HGB 15.0  HCT 45.3  MCV 90.6  PLT 209    Basic Metabolic Panel: Recent Labs  Lab 06/08/20 2358  NA 136  K 3.7  CL 100  CO2 28  GLUCOSE 86  BUN 6  CREATININE 0.81  CALCIUM 8.6*   GFR: Estimated Creatinine Clearance: 114.7 mL/min (by C-G formula based on SCr of 0.81  mg/dL). Recent Labs  Lab 06/08/20 2358  WBC 7.9    Liver Function Tests: Recent Labs  Lab 06/08/20 2358  AST 61*  ALT 73*  ALKPHOS 44  BILITOT 0.6  PROT 6.7  ALBUMIN 4.0   No results for input(s): LIPASE, AMYLASE in the last 168 hours. No results for input(s): AMMONIA in the last 168 hours.  ABG No results found for: PHART, PCO2ART, PO2ART, HCO3, TCO2,  ACIDBASEDEF, O2SAT   Coagulation Profile: No results for input(s): INR, PROTIME in the last 168 hours.  Cardiac Enzymes: No results for input(s): CKTOTAL, CKMB, CKMBINDEX, TROPONINI in the last 168 hours.  HbA1C: No results found for: HGBA1C  CBG: Recent Labs  Lab 06/09/20 0014  GLUCAP 88     This patient is critically ill with multiple organ system failure which requires frequent high complexity decision making, assessment, support, evaluation, and titration of therapies. This was completed through the application of advanced monitoring technologies and extensive interpretation of multiple databases. During this encounter critical care time was devoted to patient care services described in this note for 32 minutes.  Steffanie Dunn, DO 06/09/20 7:46 AM [facility]

## 2020-06-10 DIAGNOSIS — T50901D Poisoning by unspecified drugs, medicaments and biological substances, accidental (unintentional), subsequent encounter: Secondary | ICD-10-CM

## 2020-06-10 LAB — CBC
HCT: 44.8 % (ref 39.0–52.0)
Hemoglobin: 14.9 g/dL (ref 13.0–17.0)
MCH: 29.7 pg (ref 26.0–34.0)
MCHC: 33.3 g/dL (ref 30.0–36.0)
MCV: 89.2 fL (ref 80.0–100.0)
Platelets: 202 10*3/uL (ref 150–400)
RBC: 5.02 MIL/uL (ref 4.22–5.81)
RDW: 14.1 % (ref 11.5–15.5)
WBC: 7 10*3/uL (ref 4.0–10.5)
nRBC: 0 % (ref 0.0–0.2)

## 2020-06-10 LAB — PHOSPHORUS: Phosphorus: 2.2 mg/dL — ABNORMAL LOW (ref 2.5–4.6)

## 2020-06-10 LAB — BASIC METABOLIC PANEL
Anion gap: 9 (ref 5–15)
BUN: 8 mg/dL (ref 6–20)
CO2: 24 mmol/L (ref 22–32)
Calcium: 8.3 mg/dL — ABNORMAL LOW (ref 8.9–10.3)
Chloride: 101 mmol/L (ref 98–111)
Creatinine, Ser: 0.9 mg/dL (ref 0.61–1.24)
GFR, Estimated: >60 mL/min (ref >60)
Glucose, Bld: 160 mg/dL — ABNORMAL HIGH (ref 70–99)
Potassium: 4 mmol/L (ref 3.5–5.1)
Sodium: 134 mmol/L — ABNORMAL LOW (ref 135–145)

## 2020-06-10 LAB — MAGNESIUM: Magnesium: 2 mg/dL (ref 1.7–2.4)

## 2020-06-10 NOTE — Progress Notes (Signed)
Pt discharged from unit after AVS paperwork was given and gone over. Pt verbalized understanding of all discharge instructions. All IVs removed and pt belongings returned (clothes, cell phone, vape) Pt received taxi voucher

## 2020-06-10 NOTE — Discharge Summary (Signed)
Discharge Summary  Patient ID: Justin Arroyo MRN: [id number] DOB/AGE: Apr 05, 1991 29 y.o.  Admit date: 06/08/2020 Discharge date: 06/10/2020  Admission Diagnoses: Overdose   Discharge Diagnoses:  Active Problems:   Overdose   Discharged Condition: good  Hospital Course: 29 year old man with history of heroin use, brought in to [facility] after being found down.  Was breathing 4-6/min initially, improved with narcan. Utox positive for Benzos, cocaine, thc. Admitted to ICU. Narcan off 12/12. Since patient has remained in stable condition. Denies intention for self harm. Reports heroin use a few hours prior to event. 12/13 patient deemed stable for D/C.   Patient reports he was recently kicked out of half-way house. Has no family. Father recently passed. Friends are in [place]. Have offered a bus pass with transportation to shelter. Gave information regarding new behavioral health clinic [address]. Patient denies intent to harm self. Denies further needs/assitance at this time.    Consults: Transition of Care. Case Management.   Discharge Exam: Blood pressure (!) 107/53, pulse 79, temperature 97.9 F (36.6 C), temperature source Oral, resp. rate 13, height 5\' 4"  (1.626 m), weight 65.8 kg, SpO2 99 %. General appearance: alert and cooperative Resp: clear to auscultation bilaterally Cardio: regular rate and rhythm, S1, S2 normal, no murmur, click, rub or gallop GI: soft, non-tender; bowel sounds normal; no masses,  no organomegaly Extremities: extremities normal, atraumatic, no cyanosis or edema Neurologic: Grossly normal  Disposition: Discharge disposition: 01-Home or Self Care        Allergies as of 06/10/2020   No Known Allergies     Medication List    TAKE these medications   buPROPion 150 MG 24 hr tablet Commonly known as: WELLBUTRIN XL Take 150 mg by mouth every morning.   Mavyret 100-40 MG Tabs Generic drug: Glecaprevir-Pibrentasvir Take 3 tablets by mouth  daily with breakfast.        Signed: 06/12/2020 06/10/2020, 2:20 PM

## 2020-06-10 NOTE — Plan of Care (Signed)
  Problem: Health Behavior/Discharge Planning: Goal: Ability to manage health-related needs will improve Outcome: Progressing   Problem: Nutrition: Goal: Adequate nutrition will be maintained Outcome: Progressing   Problem: Safety: Goal: Ability to remain free from injury will improve Outcome: Progressing   Problem: Activity: Goal: Imbalance in normal sleep/wake cycle will improve Outcome: Progressing   Problem: Coping: Goal: Coping ability will improve Outcome: Progressing   Problem: Role Relationship: Goal: Will demonstrate positive changes in social behaviors and relationships Outcome: Progressing

## 2020-06-10 NOTE — TOC Initial Note (Addendum)
Transition of Care [facility]) - Initial/Assessment Note    Patient Details  Name: Justin Arroyo MRN: [id number] Date of Birth: 1990/10/03  [facility] of [facility] CM/SW Contact:    Golda Acre, RN Phone Number: 06/10/2020, 7:31 AM  Clinical Narrative:         Substance abuse resources given to patient .  29 year old man with history of heroin use, brought in to [facility] after being found down.  Was breathing 4-6/min initially, improved with narcan.   Had used heroin a few hours prior, no intention for self-harm.    Utox positive for Benzos, cocaine, thc.  plan: lives in [place] will need drug and alcohol resources. Following for progression. Expected Discharge Plan: Home/Self Care Barriers to Discharge: No Barriers Identified   Patient Goals and CMS Choice Patient states their goals for this hospitalization and ongoing recovery are:: TO GO HOME CMS Medicare.gov Compare Post Acute Care list provided to:: Patient    Expected Discharge Plan and Services Expected Discharge Plan: Home/Self Care   Discharge Planning Services: CM Consult   Living arrangements for the past 2 months: Single Family Home                                      Prior Living Arrangements/Services Living arrangements for the past 2 months: Single Family Home Lives with:: Self Patient language and need for interpreter reviewed:: Yes Do you feel safe going back to the place where you live?: Yes      Need for Family Participation in Patient Care: Yes (Comment) (father) Care giver support system in place?: Yes (comment) (father)   Criminal Activity/Legal Involvement Pertinent to Current Situation/Hospitalization: No - Comment as needed  Activities of Daily Living Home Assistive Devices/Equipment: None ADL Screening (condition at time of admission) Patient's cognitive ability adequate to safely complete daily activities?: Yes Is the patient deaf or have difficulty hearing?: No Does the  patient have difficulty seeing, even when wearing glasses/contacts?: No Does the patient have difficulty concentrating, remembering, or making decisions?: No Patient able to express need for assistance with ADLs?: No Does the patient have difficulty dressing or bathing?: No Independently performs ADLs?: Yes (appropriate for developmental age) Does the patient have difficulty walking or climbing stairs?: No Weakness of Legs: None Weakness of Arms/Hands: None  Permission Sought/Granted                  Emotional Assessment Appearance:: Appears stated age Attitude/Demeanor/Rapport: Engaged Affect (typically observed): Calm Orientation: : Oriented to Place,Oriented to Self,Oriented to  Time,Oriented to Situation Alcohol / Substance Use: Alcohol Use,Tobacco Use,Illicit Drugs Psych Involvement: No (comment)  Admission diagnosis:  Overdose [T50.901A] Accidental overdose of heroin, initial encounter [facility]) [T40.1X1A] Patient Active Problem List   Diagnosis Date Noted  . Overdose 06/09/2020  . Chronic hepatitis C without hepatic coma (HCC) 04/01/2020  . Screening for venereal disease 04/01/2020   PCP:  Patient, No Pcp Per Pharmacy:   [facility] [address] [place] - [address] [place] [address] Phone: [phone] Fax: [phone]     Social Determinants of Health (SDOH) Interventions    Readmission Risk Interventions No flowsheet data found.

## 2020-07-04 ENCOUNTER — Ambulatory Visit: Payer: Managed Care, Other (non HMO) | Admitting: Pharmacist

## 2021-06-02 ENCOUNTER — Other Ambulatory Visit: Payer: Self-pay | Admitting: Urology

## 2021-06-02 DIAGNOSIS — N281 Cyst of kidney, acquired: Secondary | ICD-10-CM
